# Patient Record
Sex: Male | Born: 1990 | Race: Black or African American | Hispanic: No | Marital: Single | Smoking: Current some day smoker
Health system: Southern US, Community
[De-identification: ages and names within clinical notes are randomized; demographics above are authoritative.]

## PROBLEM LIST (undated history)

## (undated) DIAGNOSIS — J45909 Unspecified asthma, uncomplicated: Secondary | ICD-10-CM

---

## 2011-06-21 ENCOUNTER — Encounter (HOSPITAL_COMMUNITY): Payer: Self-pay | Admitting: Emergency Medicine

## 2011-06-21 ENCOUNTER — Emergency Department (HOSPITAL_COMMUNITY)
Admission: EM | Admit: 2011-06-21 | Discharge: 2011-06-21 | Disposition: A | Discharge status: Home / Self Care | Payer: Medicaid Other | Attending: Emergency Medicine | Admitting: Emergency Medicine

## 2011-06-21 DIAGNOSIS — J039 Acute tonsillitis, unspecified: Secondary | ICD-10-CM

## 2011-06-21 MED ORDER — AZITHROMYCIN 250 MG PO TABS: ORAL_TABLET | ORAL | Status: DC

## 2011-06-21 MED ORDER — AZITHROMYCIN 250 MG PO TABS: ORAL_TABLET | ORAL | Status: AC

## 2011-06-21 NOTE — Discharge Instructions (Signed)
Gargle with salt water, use throat lozenges, and take Advil for pain. See the doctor of your choice for problems.     Tonsillitis Tonsils are lumps of tissue at the back of the throat. Tonsillitis is an infection of the throat. This infection causes the tonsils to become red, tender, and puffy (swollen). If germs (bacteria) caused the infection, an antibiotic medicine will be given to you. If your tonsillitis is severe and happens often, you may need to get your tonsils removed (tonsillectomy). HOME CARE   Rest and sleep often.   Drink enough fluids to keep your pee (urine) clear or pale yellow.   While your throat is sore, eat soft or liquid foods like:   Soup.   Ice cream.   Instant breakfast drinks.   Eat frozen ice pops.   Gargle with a warm or cold liquid to help soothe the throat. Gargle with a water and salt mix. Mix 1 teaspoon of salt in 1 cup of water.   Only take medicines as told by your doctor.   If you are given medicines (antibiotics), take them as told. Finish them even if you start to feel better.  GET HELP RIGHT AWAY IF:   You throw up (vomit).   You have a very bad headache.   You have a stiff neck.   You have chest pain.   You have trouble breathing or swallowing.   You have bad throat pain, drooling, or your voice changes.   You have bad pain not helped by medicine.   You cannot fully open your mouth.   You have redness, puffiness, or bad pain in the neck.   You have a fever.   The patient is a child younger than 3 months and has a fever.   The patient is a child older than 3 months and has a fever or problems that do not go away.   The patient is a child older than 3 months, has a fever, and his or her problems get worse.   You have large, tender lumps on your neck.   You have a rash.   You cough up green, yellow-brown, or bloody fluid.   You cannot swallow liquids or food for 24 hours.   The patient is a child and cannot swallow  liquids or food for 12 hours.  MAKE SURE YOU:   Understand these instructions.   Will watch your condition.   Will get help right away if you are not doing well or get worse.  Document Released: 07/10/2007 Document Revised: 01/10/2011 Document Reviewed: 03/29/2010 [facility] Patient Information 2012 [place], [place].   RESOURCE GUIDE  Dental Problems  Patients with Medicaid: [facility] [phone] [address]                                           [phone] [address] Phone:  [phone]                                                  Phone:  [phone]  If unable to pay or uninsured, contact:  [facility] or [facility]. to become qualified for the adult dental clinic.  Chronic Pain Problems Contact [facility]  [phone] Patients need to be referred by their primary care doctor.  Insufficient Money for Medicine Contact [employer] Way:  call "211" or [facility] (936)809-4304.  No Primary Care Doctor Call [facility]  ([phone] Other agencies that provide inexpensive medical care    [facility]  [phone]    [facility]  [phone]    [facility]  [phone]    [facility]  [phone]    [facility]  [phone]    [facility]  [phone]  Psychological Services [facility]  [phone] [facility]  [phone] [facility]   [phone] (emergency services [phone])  Substance Abuse Resources [facility]  ([phone] [facility] ([phone] [facility] [place] [phone] Floydene Flock [phone] Residential & Outpatient Substance Abuse Program  ([phone]  Abuse/Neglect [facility] Child Abuse Hotline 5043017656 [facility] Child Abuse Hotline 939-783-7548 (After Hours)  Emergency Shelter [facility] [phone]  Maternity  Homes Room at the [place] [facility] [phone])[phone] [facility] [phone]  MRSA Hotline #:   236-295-1801    [facility] Resources  [facility] [place]     [employer]                          [facility] [address] [place]                       [address]      [address]  Blondell Reveal Phone:  [phone]                                   Phone:  [phone]                 Phone:  [phone]  [facility] Phone:  [phone]  [facility] Child Abuse Hotline 8541395700 [phone] (After Hours)

## 2011-06-21 NOTE — ED Notes (Signed)
Pt states that he  Slept ouside Monday night and Tuesday woke up w/ sorethroat and body  Aches chills

## 2011-06-21 NOTE — ED Provider Notes (Signed)
History  This chart was scribed for Flint Melter, MD by Bennett Scrape. This patient was seen in room STRE5/STRE5 and the patient's care was started at 11:25AM.  CSN: [id number]  Arrival date & time 06/21/11  1045   First MD Initiated Contact with Patient 06/21/11 1125      Chief Complaint  Patient presents with  . Sore Throat    The history is provided by the patient. No language interpreter was used.    Cameron Turner is a 21 y.o. male who presents to the Emergency Department complaining of 4 days of gradual onset, gradually worsening, constant right-sided sore throat with associated fevers, productive cough of mucus and generalized HA. Fevers were not measured at home. Pt does not currently have a fever in the ED. He states that the pain is worse with swallowing. Pt reports taking Advil and chloraseptic spray with mild temporary improvement. He denies any sick contacts with similar symptoms. He denies abdominal pain, chest pain, back pain, nausea and emesis as associated symptoms. He has no h/o chronic medical conditions.   History reviewed. No pertinent past medical history.  History reviewed. No pertinent past surgical history.  No family history on file.  History  Substance Use Topics  . Smoking status: Not on file  . Smokeless tobacco: Not on file  . Alcohol Use: Not on file      Review of Systems  A complete 10 system review of systems was obtained and all systems are negative except as noted in the HPI and PMH.   Allergies  Review of patient's allergies indicates no known allergies.  Home Medications   Current Outpatient Rx  Name Route Sig Dispense Refill  . AZITHROMYCIN 250 MG PO TABS  Take q Day 2,1,1,1,1 6 each 0    Triage Vitals: BP 111/63  Pulse 112  Temp 98.7 F (37.1 C)  Resp 16  SpO2 96%  Physical Exam  Nursing note and vitals reviewed. Constitutional: He is oriented to person, place, and time. He appears well-developed and well-nourished.    HENT:  Head: Normocephalic and atraumatic.  Right Ear: External ear normal.  Left Ear: External ear normal.       Mild tonsillar swelling on the right without apparent exudate, TMs are normal bilaterally   Eyes: Conjunctivae and EOM are normal. Pupils are equal, round, and reactive to light.  Neck: Normal range of motion and phonation normal. Neck supple.       Tender right jugulodiagastric node   Cardiovascular: Normal rate, regular rhythm, normal heart sounds and intact distal pulses.   Pulmonary/Chest: Effort normal and breath sounds normal. He exhibits no bony tenderness.  Abdominal: Soft. Normal appearance. There is no tenderness.  Musculoskeletal: Normal range of motion.  Neurological: He is alert and oriented to person, place, and time. He has normal strength. No cranial nerve deficit or sensory deficit. He exhibits normal muscle tone. Coordination normal.  Skin: Skin is warm, dry and intact.  Psychiatric: He has a normal mood and affect. His behavior is normal. Judgment and thought content normal.    ED Course  Procedures (including critical care time)  DIAGNOSTIC STUDIES: Oxygen Saturation is 96% on room air, adequate by my interpretation.    COORDINATION OF CARE: 11:40AM-Informed pt of negative strep test. Discussed salt water gurgling, Halls and chloraseptic sprays as discharge plan with pt and pt agreed to plan.    Labs Reviewed  RAPID STREP SCREEN   No results found.   1. Tonsillitis  MDM  Tonsillitis with peritonsillar infection. Doubt serious bacterial illness. He is stable for discharge   I personally performed the services described in this documentation, which was scribed in my presence. The recorded information has been reviewed and considered.    Plan: Home Medications- Zithromax; Home Treatments- Rest and fluids; Recommended follow up- PCP prn    Flint Melter, MD 06/21/11 2041

## 2012-05-01 ENCOUNTER — Emergency Department (HOSPITAL_COMMUNITY): Admission: EM | Admit: 2012-05-01 | Discharge: 2012-05-01 | Discharge status: Against Medical Advice | Payer: Self-pay

## 2012-05-01 ENCOUNTER — Emergency Department (HOSPITAL_COMMUNITY)
Admission: EM | Admit: 2012-05-01 | Discharge: 2012-05-01 | Disposition: A | Discharge status: Home / Self Care | Payer: Self-pay | Attending: Emergency Medicine | Admitting: Emergency Medicine

## 2012-05-01 DIAGNOSIS — W268XXA Contact with other sharp object(s), not elsewhere classified, initial encounter: Secondary | ICD-10-CM | POA: Insufficient documentation

## 2012-05-01 DIAGNOSIS — IMO0002 Reserved for concepts with insufficient information to code with codable children: Secondary | ICD-10-CM

## 2012-05-01 DIAGNOSIS — M7989 Other specified soft tissue disorders: Secondary | ICD-10-CM | POA: Insufficient documentation

## 2012-05-01 DIAGNOSIS — S61409A Unspecified open wound of unspecified hand, initial encounter: Secondary | ICD-10-CM | POA: Insufficient documentation

## 2012-05-01 DIAGNOSIS — Y939 Activity, unspecified: Secondary | ICD-10-CM | POA: Insufficient documentation

## 2012-05-01 DIAGNOSIS — Z23 Encounter for immunization: Secondary | ICD-10-CM | POA: Insufficient documentation

## 2012-05-01 DIAGNOSIS — Y929 Unspecified place or not applicable: Secondary | ICD-10-CM | POA: Insufficient documentation

## 2012-05-01 MED ORDER — CEPHALEXIN 500 MG PO CAPS: 500.0000 mg | ORAL_CAPSULE | Freq: Four times a day (QID) | ORAL | Status: DC

## 2012-05-01 MED ORDER — TETANUS-DIPHTH-ACELL PERTUSSIS 5-2.5-18.5 LF-MCG/0.5 IM SUSP
0.5000 mL | Freq: Once | INTRAMUSCULAR | Status: AC
  Administered 2012-05-01: 0.5 mL via INTRAMUSCULAR
  Filled 2012-05-01: qty 0.5

## 2012-05-01 MED ORDER — TRAMADOL HCL 50 MG PO TABS: 50.0000 mg | ORAL_TABLET | Freq: Four times a day (QID) | ORAL | Status: DC | PRN

## 2012-05-01 NOTE — ED Notes (Signed)
Pt c/o laceration to palm of L hand near thumb. Pt states it was cut on a piece of glass on Wed night. Pt states he was afraid to come in earlier. States wound keeps bleeding and he's been holding pressure on it. States wound is very painful. Pt arrives holding pressure to wound. Wound still bleeding slightly. Pt does not know the last time he got a tetanus shot. Pt states he has a ride home.

## 2012-05-01 NOTE — ED Provider Notes (Signed)
History    This chart was scribed for non-physician practitioner working with Raeford Razor, MD by Smitty Pluck, ED scribe. This patient was seen in room WTR8/WTR8 and the patient's care was started at 8:30 PM.   CSN: [id number]  Arrival date & time 05/01/12  2004       Chief Complaint  Patient presents with  . Extremity Laceration    The history is provided by the patient. No language interpreter was used.   Cameron Turner is a 22 y.o. male who presents to the Emergency Department complaining of left palm laceration onset 2 days ago. He reports constant, moderate left hand pain that has worsened since onset. Pt reports that he cut his left palm with a piece of glass. He states that movement of left fingers and hand aggravate. He mentions that there has been active bleeding since onset. He states he has used peroxide without relief.Pt denies fever, chills, nausea, vomiting, diarrhea, weakness, cough, SOB and any other pain. He is unsure if tetanus vaccination is UTD.    No past medical history on file.  No past surgical history on file.  No family history on file.  History  Substance Use Topics  . Smoking status: Not on file  . Smokeless tobacco: Not on file  . Alcohol Use: Not on file      Review of Systems  Constitutional: Negative for fever and chills.  Respiratory: Negative for shortness of breath.   Gastrointestinal: Negative for nausea and vomiting.  Neurological: Negative for weakness.  All other systems reviewed and are negative.    Allergies  Review of patient's allergies indicates no known allergies.  Home Medications   Current Outpatient Rx  Name  Route  Sig  Dispense  Refill  . cephALEXin (KEFLEX) 500 MG capsule   Oral   Take 1 capsule (500 mg total) by mouth 4 (four) times daily.   28 capsule   0   . traMADol (ULTRAM) 50 MG tablet   Oral   Take 1 tablet (50 mg total) by mouth every 6 (six) hours as needed for pain.   15 tablet   0     BP  124/70  Pulse 69  Temp(Src) 98.2 F (36.8 C) (Oral)  Resp 16  SpO2 100%  Physical Exam  Nursing note and vitals reviewed. Constitutional: He is oriented to person, place, and time. He appears well-developed and well-nourished. No distress.  HENT:  Head: Normocephalic and atraumatic.  Eyes: EOM are normal.  Neck: Neck supple. No tracheal deviation present.  Cardiovascular: Normal rate.   Pulmonary/Chest: Effort normal. No respiratory distress.  Musculoskeletal: Normal range of motion.       Left hand: He exhibits tenderness, laceration and swelling.       Hands: "V' shaped alceration to thenar eminence. Swollen. Wound edges are smooth and clean. Wound still bleeding.  Neurological: He is alert and oriented to person, place, and time.  Skin: Skin is warm and dry.  Psychiatric: He has a normal mood and affect. His behavior is normal.    ED Course  Procedures (including critical care time) DIAGNOSTIC STUDIES: Oxygen Saturation is 100% on room air, normal by my interpretation.    COORDINATION OF CARE: 8:33 PM Discussed ED treatment with pt and pt agrees.   LACERATION REPAIR PROCEDURE NOTE The patient's identification was confirmed and consent was obtained. This procedure was performed by Marlon Pel PA-C at 8:33 PM. Site: left palm Sterile procedures observed: betadine Anesthetic used (type and  amt): 6 ml 1% lidocaine with epi Suture type/size:5-O proline Length:1 cm # of Sutures: 2 Technique:simple interrupted Complexity: complex  Antibx ointment applied: no Tetanus UTD or ordered: ordered Site anesthetized, irrigated with NS, explored without evidence of foreign body, wound well approximated, site covered with dry, sterile dressing.  Patient tolerated procedure well without complications. Instructions for care discussed verbally and patient provided with additional written instructions for homecare and f/u.    Labs Reviewed - No data to display No results  found.   1. Laceration       MDM  Wound thoroughly irrigated and loosely approximated. Wound still bleeding from Wednesday so sutures were necessary.  Rx keflex. Tetanus given in ED.   Pt has been advised of the symptoms that warrant their return to the ED. Patient has voiced understanding and has agreed to follow-up with the PCP or specialist.  I personally performed the services described in this documentation, which was scribed in my presence. The recorded information has been reviewed and is accurate.   Dorthula Matas, PA-C 05/01/12 2220

## 2012-05-06 NOTE — ED Provider Notes (Signed)
Medical screening examination/treatment/procedure(s) were performed by non-physician practitioner and as supervising physician I was immediately available for consultation/collaboration.  Jak Haggar, MD 05/06/12 1304 

## 2012-08-22 ENCOUNTER — Emergency Department (HOSPITAL_COMMUNITY)
Admission: EM | Admit: 2012-08-22 | Discharge: 2012-08-23 | Disposition: A | Discharge status: Home / Self Care | Payer: Self-pay | Attending: Emergency Medicine | Admitting: Emergency Medicine

## 2012-08-22 ENCOUNTER — Encounter (HOSPITAL_COMMUNITY): Payer: Self-pay | Admitting: Emergency Medicine

## 2012-08-22 ENCOUNTER — Emergency Department (HOSPITAL_COMMUNITY): Payer: Self-pay

## 2012-08-22 DIAGNOSIS — R509 Fever, unspecified: Secondary | ICD-10-CM | POA: Insufficient documentation

## 2012-08-22 DIAGNOSIS — R05 Cough: Secondary | ICD-10-CM | POA: Insufficient documentation

## 2012-08-22 DIAGNOSIS — J45901 Unspecified asthma with (acute) exacerbation: Secondary | ICD-10-CM | POA: Insufficient documentation

## 2012-08-22 DIAGNOSIS — R059 Cough, unspecified: Secondary | ICD-10-CM | POA: Insufficient documentation

## 2012-08-22 DIAGNOSIS — F17200 Nicotine dependence, unspecified, uncomplicated: Secondary | ICD-10-CM | POA: Insufficient documentation

## 2012-08-22 DIAGNOSIS — R0789 Other chest pain: Secondary | ICD-10-CM | POA: Insufficient documentation

## 2012-08-22 DIAGNOSIS — J3489 Other specified disorders of nose and nasal sinuses: Secondary | ICD-10-CM | POA: Insufficient documentation

## 2012-08-22 DIAGNOSIS — R5381 Other malaise: Secondary | ICD-10-CM | POA: Insufficient documentation

## 2012-08-22 DIAGNOSIS — R51 Headache: Secondary | ICD-10-CM | POA: Insufficient documentation

## 2012-08-22 DIAGNOSIS — J4521 Mild intermittent asthma with (acute) exacerbation: Secondary | ICD-10-CM

## 2012-08-22 HISTORY — DX: Unspecified asthma, uncomplicated: J45.909

## 2012-08-22 MED ORDER — ALBUTEROL SULFATE (5 MG/ML) 0.5% IN NEBU
5.0000 mg | INHALATION_SOLUTION | RESPIRATORY_TRACT | Status: DC | PRN
  Administered 2012-08-22: 5 mg via RESPIRATORY_TRACT
  Filled 2012-08-22: qty 1

## 2012-08-22 MED ORDER — PREDNISONE 20 MG PO TABS
40.0000 mg | ORAL_TABLET | Freq: Once | ORAL | Status: AC
  Administered 2012-08-23: 40 mg via ORAL
  Filled 2012-08-22: qty 2

## 2012-08-22 MED ORDER — IPRATROPIUM BROMIDE 0.02 % IN SOLN
0.5000 mg | Freq: Once | RESPIRATORY_TRACT | Status: AC
  Administered 2012-08-22: 0.5 mg via RESPIRATORY_TRACT
  Filled 2012-08-22: qty 2.5

## 2012-08-22 NOTE — ED Notes (Signed)
Patient states that he has has had a cough and chest tightness for the last 3 days.

## 2012-08-22 NOTE — ED Notes (Signed)
RT called for neb tx.

## 2012-08-22 NOTE — ED Provider Notes (Signed)
History    This chart was scribed for non-physician practitioner Roxy Horseman, PA working with Olivia Mackie, MD by Quintella Reichert, ED Scribe. This patient was seen in room WTR7/WTR7 and the patient's care was started at 11:33 PM.   CSN: [id number]  Arrival date & time 08/22/12  2302    Chief Complaint  Patient presents with  . Shortness of Breath    The history is provided by the patient. No language interpreter was used.     HPI Comments: Cameron Turner is a 22 y.o. male with h/o asthma who presents to the Emergency Department complaining of 3 days of gradual-onset, gradually-worsening chest tightness with accompanying cough, SOB, headaches, subjective fever, fatigue and rhinorrhea.  Pt states that he has difficulty catching his breath and he has occasionally been waking up gasping for air in the middle of the night.  He also reports intermittently coughing up blood-tinged sputum.  He also notes that when he coughs it causes his lungs to hurt.  Additionally he notes that several days ago he experienced nasal congestion which has since cleared up.  Pt has not used an albuterol inhaler for his symptoms and notes he has not used an inhaler in several years.  Pt denies recent surgeries.  He admits to a recent h/o long car ride to and from [place] several weeks ago.  He is a current smoker.      Past Medical History  Diagnosis Date  . Asthma     History reviewed. No pertinent past surgical history.   No family history on file.   History  Substance Use Topics  . Smoking status: Current Some Day Smoker  . Smokeless tobacco: Not on file  . Alcohol Use: No     Review of Systems A complete 10 system review of systems was obtained and all systems are negative except as noted in the HPI and PMH.     Allergies  Shellfish allergy  Home Medications   Current Outpatient Rx  Name  Route  Sig  Dispense  Refill  . OVER THE COUNTER MEDICATION   Oral   Take 1 tablet by mouth 2  (two) times daily as needed (cold symptoms). "Airborn" OTC supplement          BP 130/66  Pulse 95  Temp(Src) 98.4 F (36.9 C) (Oral)  Resp 18  Wt 190 lb (86.183 kg)  SpO2 100%  Physical Exam  Nursing note and vitals reviewed. Constitutional: He is oriented to person, place, and time. He appears well-developed and well-nourished. No distress.  HENT:  Head: Normocephalic and atraumatic.  Right Ear: External ear normal.  Left Ear: External ear normal.  Nose: Nose normal.  Mouth/Throat: Oropharynx is clear and moist. No oropharyngeal exudate.  Eyes: EOM are normal.  Neck: Neck supple. No tracheal deviation present.  Cardiovascular: Normal rate, regular rhythm and normal heart sounds.  Exam reveals no gallop and no friction rub.   No murmur heard. Pulmonary/Chest: Effort normal. No respiratory distress. He has wheezes. He has no rales. He exhibits no tenderness.  Diffuse wheezes throughout.  Abdominal: Soft. He exhibits no distension.  Musculoskeletal: Normal range of motion.  Neurological: He is alert and oriented to person, place, and time.  Skin: Skin is warm and dry.  Psychiatric: He has a normal mood and affect. His behavior is normal.    ED Course  Procedures (including critical care time)  DIAGNOSTIC STUDIES: Oxygen Saturation is 100% on room air, normal by my interpretation.  COORDINATION OF CARE: 11:37 PM-Informed pt that imaging ruled out pneumonia.  Discussed treatment plan which includes breathing treatment and steroids with pt at bedside and pt agreed to plan.   12:11 AM Lungs are clear to auscultation on reexam.  Labs Reviewed - No data to display  Dg Chest 2 View (if Patient Has Fever And/or Copd)  08/22/2012   *RADIOLOGY REPORT*  Clinical Data: Shortness of breath cough, history of asthma  CHEST - 2 VIEW  Comparison: None.  Findings: The cardiac and mediastinal silhouettes are within normal limits.  The lungs are well inflated.  No airspace  consolidation, pleural effusion, or pulmonary edema identified.  The soft tissues and osseous structures are unremarkable.  IMPRESSION: Normal chest radiograph.  No acute cardiopulmonary process identified.   Original Report Authenticated By: Rise Mu, M.D.    1. Asthma exacerbation, mild intermittent     MDM  Patient ambulated in ED with O2 saturations maintained >90, no current signs of respiratory distress. Lung exam improved after nebulizer treatment. Prednisone given in the ED and pt will bd dc with 5 day burst. Pt states they are breathing at baseline. Pt has been instructed to continue using prescribed medications and to speak with PCP about today's exacerbation.   Perc negative.    I personally performed the services described in this documentation, which was scribed in my presence. The recorded information has been reviewed and is accurate.    Roxy Horseman, PA-C 08/23/12 [phone]

## 2012-08-22 NOTE — ED Notes (Signed)
RT at bedside.

## 2012-08-23 MED ORDER — ALBUTEROL SULFATE HFA 108 (90 BASE) MCG/ACT IN AERS: 2.0000 | INHALATION_SPRAY | RESPIRATORY_TRACT | Status: AC | PRN

## 2012-08-23 MED ORDER — PREDNISONE 20 MG PO TABS: 40.0000 mg | ORAL_TABLET | Freq: Every day | ORAL | Status: AC

## 2012-08-23 MED ORDER — ALBUTEROL SULFATE HFA 108 (90 BASE) MCG/ACT IN AERS
2.0000 | INHALATION_SPRAY | RESPIRATORY_TRACT | Status: DC | PRN
  Filled 2012-08-23: qty 6.7

## 2012-08-23 NOTE — ED Provider Notes (Signed)
Medical screening examination/treatment/procedure(s) were performed by non-physician practitioner and as supervising physician I was immediately available for consultation/collaboration.  Everest Brod M Dimonique Bourdeau, MD 08/23/12 0620 

## 2012-11-23 ENCOUNTER — Encounter (HOSPITAL_COMMUNITY): Payer: Self-pay | Admitting: Emergency Medicine

## 2012-11-23 ENCOUNTER — Emergency Department (INDEPENDENT_AMBULATORY_CARE_PROVIDER_SITE_OTHER)
Admission: EM | Admit: 2012-11-23 | Discharge: 2012-11-23 | Disposition: A | Discharge status: Home / Self Care | Payer: Self-pay | Source: Home / Self Care | Attending: Family Medicine | Admitting: Family Medicine

## 2012-11-23 DIAGNOSIS — J4521 Mild intermittent asthma with (acute) exacerbation: Secondary | ICD-10-CM

## 2012-11-23 DIAGNOSIS — J45901 Unspecified asthma with (acute) exacerbation: Secondary | ICD-10-CM

## 2012-11-23 MED ORDER — METHYLPREDNISOLONE ACETATE 40 MG/ML IJ SUSP
80.0000 mg | Freq: Once | INTRAMUSCULAR | Status: AC
  Administered 2012-11-23: 80 mg via INTRAMUSCULAR

## 2012-11-23 MED ORDER — IPRATROPIUM BROMIDE 0.02 % IN SOLN
0.5000 mg | Freq: Once | RESPIRATORY_TRACT | Status: AC
  Administered 2012-11-23: 0.5 mg via RESPIRATORY_TRACT

## 2012-11-23 MED ORDER — ALBUTEROL SULFATE HFA 108 (90 BASE) MCG/ACT IN AERS: 1.0000 | INHALATION_SPRAY | Freq: Four times a day (QID) | RESPIRATORY_TRACT | Status: AC | PRN

## 2012-11-23 MED ORDER — SODIUM CHLORIDE 0.9 % IJ SOLN
INTRAMUSCULAR | Status: AC
  Filled 2012-11-23: qty 3

## 2012-11-23 MED ORDER — ALBUTEROL SULFATE (5 MG/ML) 0.5% IN NEBU
5.0000 mg | INHALATION_SOLUTION | Freq: Once | RESPIRATORY_TRACT | Status: AC
  Administered 2012-11-23: 5 mg via RESPIRATORY_TRACT

## 2012-11-23 NOTE — ED Notes (Signed)
C/o sob for 3-4 hours today.   Patient was using albuterol but the medication has ran out and patient can not afford medication

## 2012-11-23 NOTE — ED Provider Notes (Signed)
CSN: [id number]     Arrival date & time 11/23/12  1727 History   First MD Initiated Contact with Patient 11/23/12 1901     Chief Complaint  Patient presents with  . Shortness of Breath   (Consider location/radiation/quality/duration/timing/severity/associated sxs/prior Treatment) Patient is a 22 y.o. male presenting with shortness of breath. The history is provided by the patient.  Shortness of Breath Severity:  Mild Onset quality:  Gradual Duration:  3 days Timing:  Intermittent Progression:  Waxing and waning Chronicity:  New Context comment:  Smoker Associated symptoms: wheezing   Associated symptoms: no abdominal pain and no chest pain   Risk factors: tobacco use     Past Medical History  Diagnosis Date  . Asthma    History reviewed. No pertinent past surgical history. History reviewed. No pertinent family history. History  Substance Use Topics  . Smoking status: Current Some Day Smoker  . Smokeless tobacco: Not on file  . Alcohol Use: No    Review of Systems  Constitutional: Negative.   HENT: Negative.   Respiratory: Positive for shortness of breath and wheezing.   Cardiovascular: Negative for chest pain.  Gastrointestinal: Negative for abdominal pain.    Allergies  Shellfish allergy  Home Medications   Current Outpatient Rx  Name  Route  Sig  Dispense  Refill  . albuterol (PROVENTIL HFA;VENTOLIN HFA) 108 (90 BASE) MCG/ACT inhaler   Inhalation   Inhale 2 puffs into the lungs every 4 (four) hours as needed for wheezing or shortness of breath.   1 Inhaler   3   . albuterol (PROVENTIL HFA;VENTOLIN HFA) 108 (90 BASE) MCG/ACT inhaler   Inhalation   Inhale 1-2 puffs into the lungs every 6 (six) hours as needed for wheezing.   1 Inhaler   0   . OVER THE COUNTER MEDICATION   Oral   Take 1 tablet by mouth 2 (two) times daily as needed (cold symptoms). "Airborn" OTC supplement         . predniSONE (DELTASONE) 20 MG tablet   Oral   Take 2 tablets (40  mg total) by mouth daily.   10 tablet   0    BP 130/65  Pulse 71  Temp(Src) 98.1 F (36.7 C) (Oral)  Resp 16  SpO2 97% Physical Exam  Nursing note and vitals reviewed. Constitutional: He is oriented to person, place, and time. He appears well-developed and well-nourished.  HENT:  Right Ear: External ear normal.  Left Ear: External ear normal.  Mouth/Throat: Oropharynx is clear and moist.  Neck: Normal range of motion. Neck supple.  Cardiovascular: Normal rate.   Pulmonary/Chest: Effort normal and breath sounds normal. He has no wheezes.  Lymphadenopathy:    He has no cervical adenopathy.  Neurological: He is alert and oriented to person, place, and time.  Skin: Skin is warm and dry.    ED Course  Procedures (including critical care time) Labs Review Labs Reviewed - No data to display Imaging Review No results found.    MDM      Linna Hoff, MD 11/23/12 [phone]

## 2014-10-21 IMAGING — CR DG CHEST 2V
2 series · 2 of 2 positions shown · non-contrast
Comparison: None.

CLINICAL DATA: Shortness of breath cough, history of asthma

CHEST - 2 VIEW

[w chest pa]
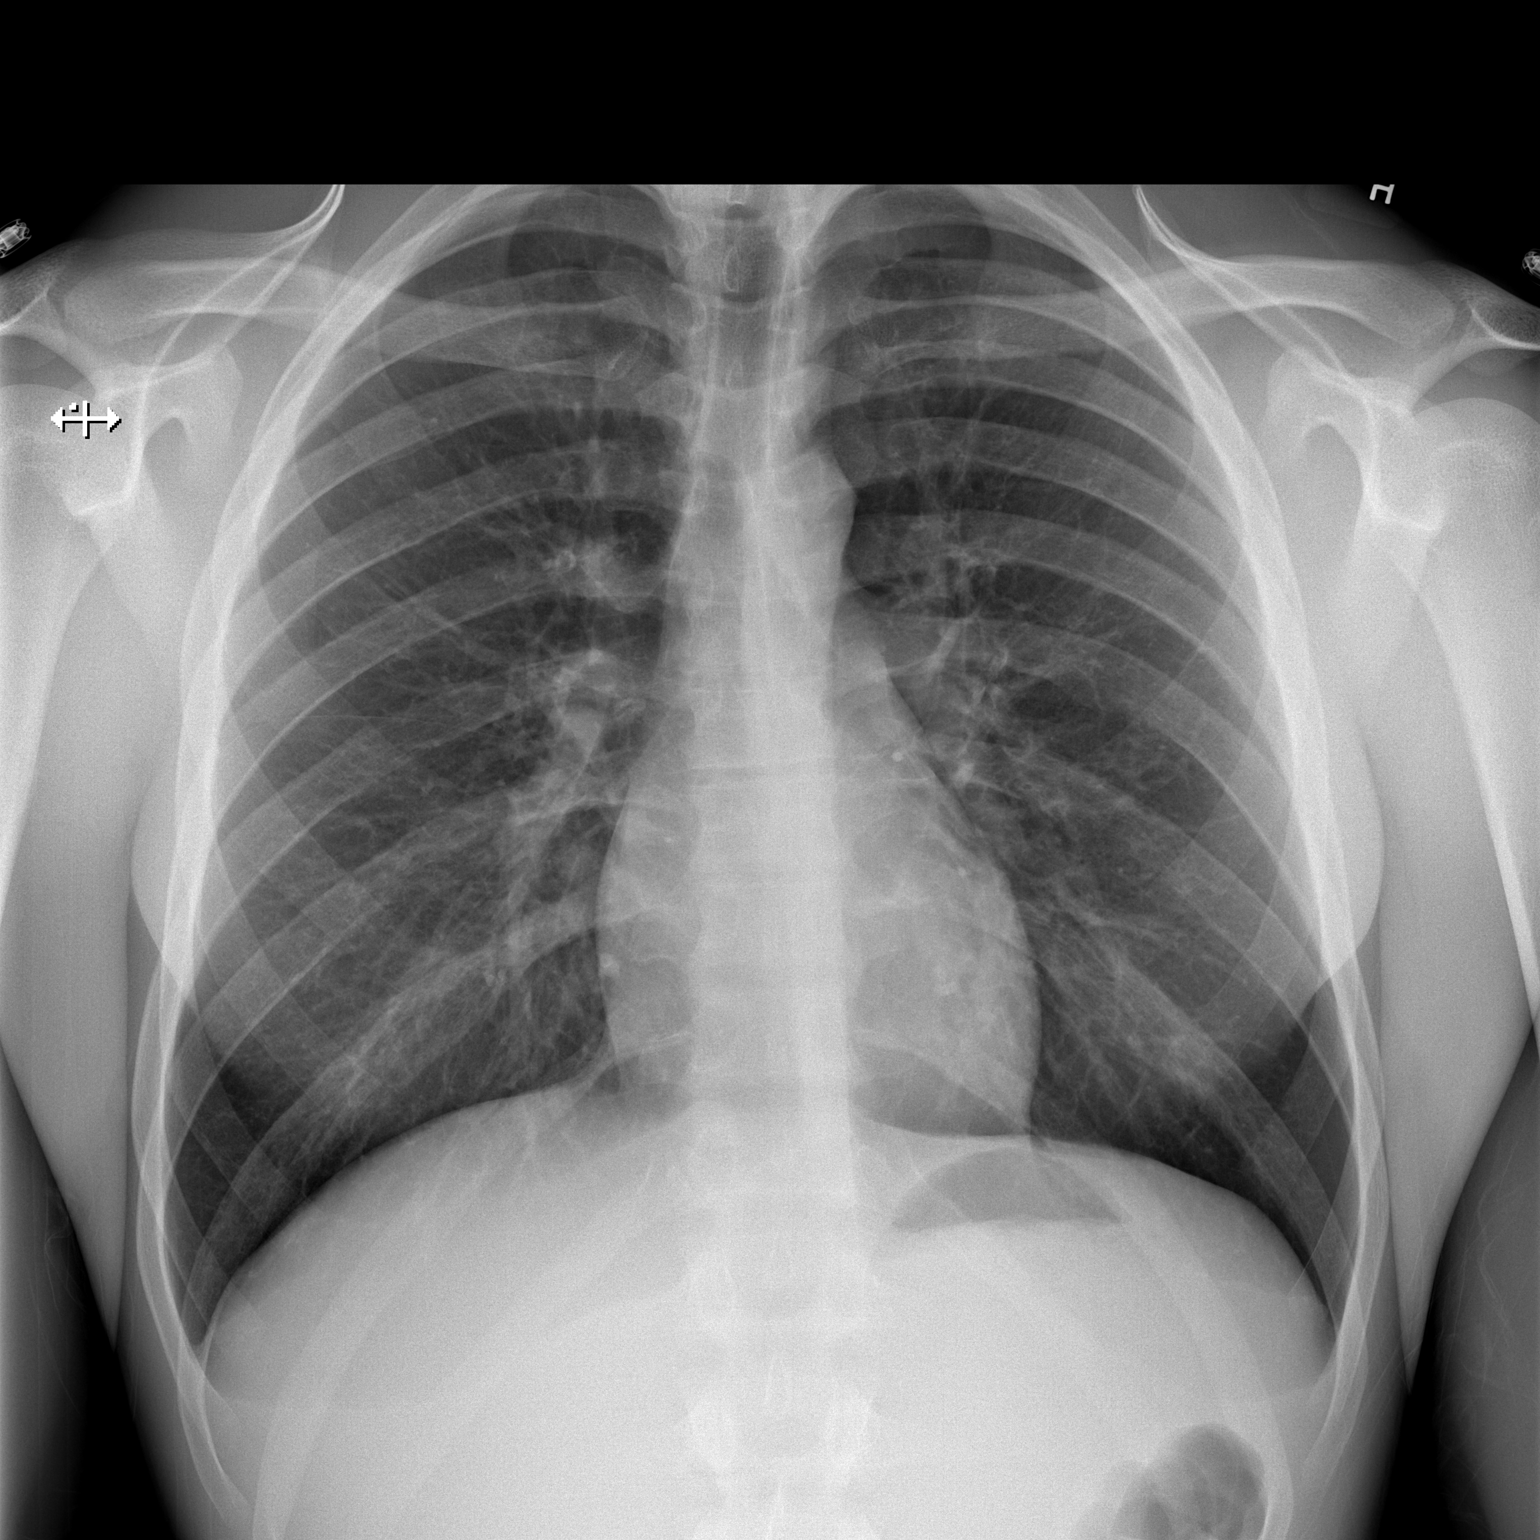

[w chest lat]
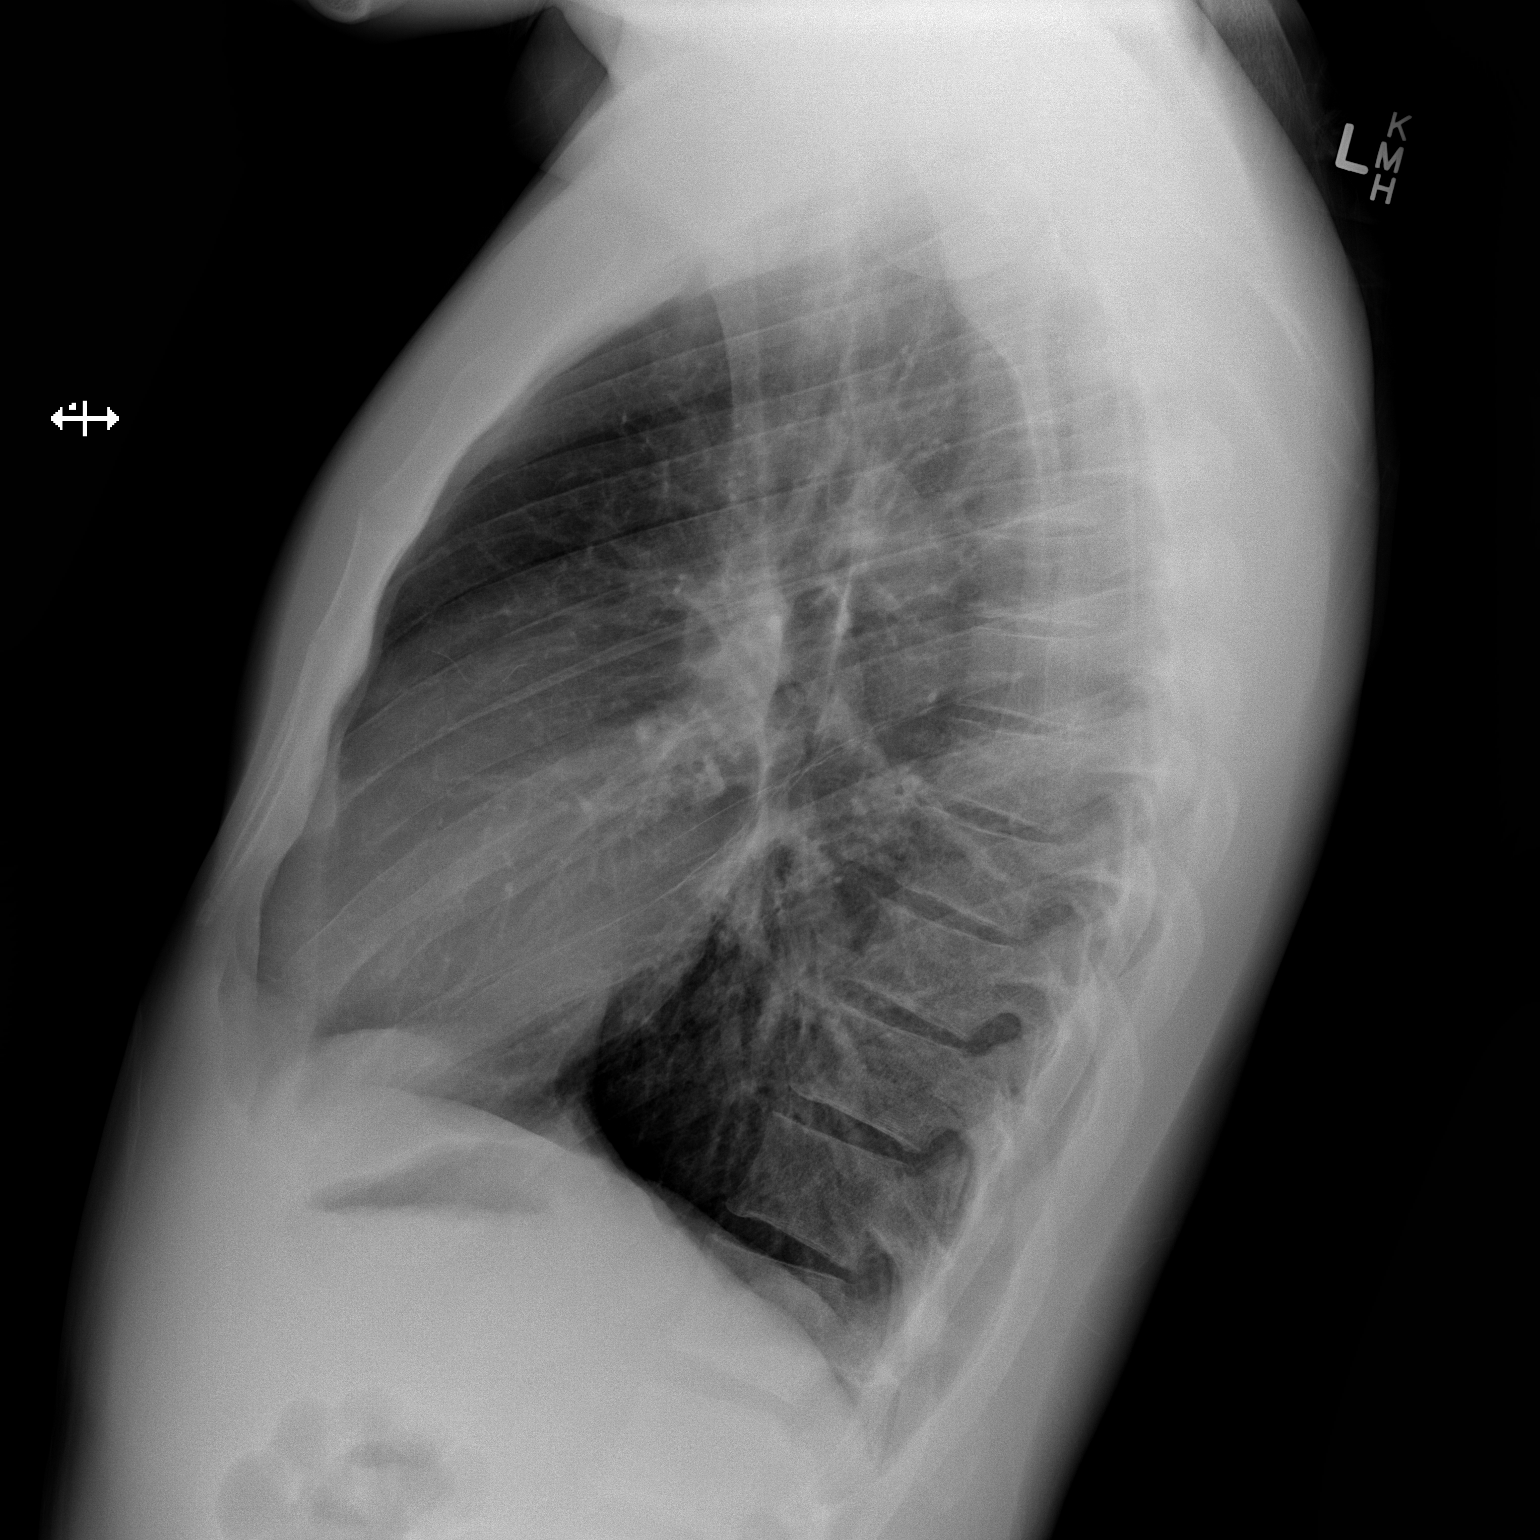

[2 of 2 positions shown; findings below may reference images not displayed]

FINDINGS: The cardiac and mediastinal silhouettes are within normal
limits.

The lungs are well inflated.  No airspace consolidation, pleural
effusion, or pulmonary edema identified.

The soft tissues and osseous structures are unremarkable.
IMPRESSION: Normal chest radiograph.  No acute cardiopulmonary process
identified.]

## 2015-06-09 DIAGNOSIS — Z79899 Other long term (current) drug therapy: Secondary | ICD-10-CM | POA: Insufficient documentation

## 2015-06-09 DIAGNOSIS — J45909 Unspecified asthma, uncomplicated: Secondary | ICD-10-CM | POA: Insufficient documentation

## 2015-06-09 DIAGNOSIS — J069 Acute upper respiratory infection, unspecified: Secondary | ICD-10-CM | POA: Insufficient documentation

## 2015-06-09 DIAGNOSIS — F17200 Nicotine dependence, unspecified, uncomplicated: Secondary | ICD-10-CM | POA: Insufficient documentation

## 2015-06-10 ENCOUNTER — Encounter (HOSPITAL_COMMUNITY): Payer: Self-pay | Admitting: *Deleted

## 2015-06-10 ENCOUNTER — Emergency Department (HOSPITAL_COMMUNITY): Payer: Medicaid Other

## 2015-06-10 ENCOUNTER — Emergency Department (HOSPITAL_COMMUNITY)
Admission: EM | Admit: 2015-06-10 | Discharge: 2015-06-10 | Disposition: A | Discharge status: Home / Self Care | Payer: Medicaid Other | Attending: Emergency Medicine | Admitting: Emergency Medicine

## 2015-06-10 DIAGNOSIS — J069 Acute upper respiratory infection, unspecified: Secondary | ICD-10-CM

## 2015-06-10 LAB — I-STAT CHEM 8, ED
BUN: 11 mg/dL (ref 6–20)
Calcium, Ion: 1.14 mmol/L (ref 1.12–1.23)
Chloride: 100 mmol/L — ABNORMAL LOW (ref 101–111)
Creatinine, Ser: 1.1 mg/dL (ref 0.61–1.24)
Glucose, Bld: 96 mg/dL (ref 65–99)
HEMATOCRIT: 48 % (ref 39.0–52.0)
Hemoglobin: 16.3 g/dL (ref 13.0–17.0)
POTASSIUM: 3.9 mmol/L (ref 3.5–5.1)
SODIUM: 140 mmol/L (ref 135–145)
TCO2: 24 mmol/L (ref 0–100)

## 2015-06-10 LAB — RAPID STREP SCREEN (MED CTR MEBANE ONLY): Streptococcus, Group A Screen (Direct): NEGATIVE

## 2015-06-10 MED ORDER — GUAIFENESIN-CODEINE 100-10 MG/5ML PO SOLN: 5.0000 mL | Freq: Three times a day (TID) | ORAL | Status: AC | PRN

## 2015-06-10 MED ORDER — AMOXICILLIN 500 MG PO CAPS
500.0000 mg | ORAL_CAPSULE | Freq: Once | ORAL | Status: AC
  Administered 2015-06-10: 500 mg via ORAL
  Filled 2015-06-10: qty 1

## 2015-06-10 MED ORDER — ACETAMINOPHEN 325 MG PO TABS
650.0000 mg | ORAL_TABLET | Freq: Once | ORAL | Status: AC
  Administered 2015-06-10: 650 mg via ORAL
  Filled 2015-06-10: qty 2

## 2015-06-10 MED ORDER — OXYCODONE-ACETAMINOPHEN 5-325 MG PO TABS
1.0000 | ORAL_TABLET | Freq: Once | ORAL | Status: AC
Start: 2015-06-10 — End: 2015-06-10
  Administered 2015-06-10: 1 via ORAL
  Filled 2015-06-10: qty 1

## 2015-06-10 MED ORDER — AMOXICILLIN 500 MG PO CAPS: 500.0000 mg | ORAL_CAPSULE | Freq: Three times a day (TID) | ORAL | Status: AC

## 2015-06-10 NOTE — ED Notes (Signed)
He was actually in the garage yesterday

## 2015-06-10 NOTE — ED Provider Notes (Signed)
CSN: [id number]     Arrival date & time 06/09/15  2357 History   First MD Initiated Contact with Patient 06/10/15 0145     Chief Complaint  Patient presents with  . Cough  . Fever     (Consider location/radiation/quality/duration/timing/severity/associated sxs/prior Treatment) HPI   Patient has a past medical history of asthma and smoking presents to the emergency department for evaluation of his cough, sore throat, nasal congestion, fever and not feeling well. He does smell of gasoline. 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 He reports sleeping fumes for a little under an hour. He denies having any headache, confusion or change in vision, weakness, syncope, Lotrimin swelling shortness of breath, back pain, abdominal pain, nausea, vomiting, diarrhea. Max temp is 100.3  Past Medical History  Diagnosis Date  . Asthma    History reviewed. No pertinent past surgical history. No family history on file. Social History  Substance Use Topics  . Smoking status: Current Some Day Smoker  . Smokeless tobacco: None  . Alcohol Use: No    Review of Systems  Review of Systems All other systems negative except as documented in the HPI. All pertinent positives and negatives as reviewed in the HPI.   Allergies  Shellfish allergy  Home Medications   Prior to Admission medications   Medication Sig Start Date End Date Taking? Authorizing Provider  albuterol (PROVENTIL HFA;VENTOLIN HFA) 108 (90 BASE) MCG/ACT inhaler Inhale 2 puffs into the lungs every 4 (four) hours as needed for wheezing or shortness of breath. Patient not taking: Reported on 06/10/2015 08/23/12   Roxy Horseman, PA-C  albuterol (PROVENTIL HFA;VENTOLIN HFA) 108 (90 BASE) MCG/ACT inhaler Inhale 1-2 puffs into the lungs every 6 (six) hours as needed for wheezing. Patient not taking: Reported on 06/10/2015 11/23/12   Linna Hoff, MD  amoxicillin (AMOXIL) 500 MG capsule Take 1  capsule (500 mg total) by mouth 3 (three) times daily. 06/10/15   Zalmen Wrightsman Neva Seat, PA-C  guaiFENesin-codeine 100-10 MG/5ML syrup Take 5-10 mLs by mouth 3 (three) times daily as needed for cough. 06/10/15   Ankur Snowdon Neva Seat, PA-C  predniSONE (DELTASONE) 20 MG tablet Take 2 tablets (40 mg total) by mouth daily. Patient not taking: Reported on 06/10/2015 08/23/12   Roxy Horseman, PA-C   BP 132/62 mmHg  Pulse 79  Temp(Src) 99.1 F (37.3 C) (Oral)  Resp 18  Ht  (1.854 m)  Wt 86.24 kg  BMI 25.09 kg/m2  SpO2 97% Physical Exam  Constitutional: He is oriented to person, place, and time. He appears well-developed and well-nourished. No distress.  HENT:  Head: Normocephalic and atraumatic.  Right Ear: Tympanic membrane, external ear and ear canal normal.  Left Ear: Tympanic membrane, external ear and ear canal normal.  Nose: Nose normal. No rhinorrhea. Right sinus exhibits no maxillary sinus tenderness and no frontal sinus tenderness. Left sinus exhibits no maxillary sinus tenderness and no frontal sinus tenderness.  Mouth/Throat: Uvula is midline and mucous membranes are normal. No trismus in the jaw. Normal dentition. No dental abscesses or uvula swelling. Posterior oropharyngeal edema present. No oropharyngeal exudate, posterior oropharyngeal erythema or tonsillar abscesses.  No submental edema, tongue not elevated, no trismus. No impending airway obstruction; Pt able to speak full sentences, swallow intact, no drooling, stridor, or tonsillar/uvula displacement. No palatal petechia  Eyes: Conjunctivae are normal.  Neck: Trachea normal, normal range of motion and full passive range of motion without pain.  Neck supple. No rigidity. Normal range of motion present. No Brudzinski's sign noted.  Flexion and extension of neck without pain or difficulty. Able to breath without difficulty in extension.  Cardiovascular: Normal rate and regular rhythm.   Pulmonary/Chest: Effort normal and breath sounds normal.  No stridor. No respiratory distress. He has no wheezes.  Abdominal: Soft. There is no tenderness.  No obvious evidence of splenomegaly. Non ttp.   Musculoskeletal: Normal range of motion.  Lymphadenopathy:       Head (right side): No preauricular and no posterior auricular adenopathy present.       Head (left side): No preauricular and no posterior auricular adenopathy present.    He has cervical adenopathy.  Neurological: He is alert and oriented to person, place, and time.  Skin: Skin is warm and dry. No rash noted. He is not diaphoretic.  Psychiatric: He has a normal mood and affect.  Nursing note and vitals reviewed.   ED Course  Procedures (including critical care time) Labs Review Labs Reviewed  I-STAT CHEM 8, ED - Abnormal; Notable for the following:    Chloride 100 (*)    All other components within normal limits  RAPID STREP SCREEN (NOT AT [facility])  CULTURE, GROUP A STREP [facility])    Imaging Review Dg Chest 2 View  06/10/2015  CLINICAL DATA:  Inhaled fumes.  Shortness of breath. EXAM: CHEST  2 VIEW COMPARISON:  August 22, 2012 FINDINGS: The heart size and mediastinal contours are within normal limits. Both lungs are clear. The visualized skeletal structures are unremarkable. IMPRESSION: No active cardiopulmonary disease. Electronically Signed   By: Gerome Samavid  Williams III M.D   On: 06/10/2015 00:27   I have personally reviewed and evaluated these images and lab results as part of my medical decision-making.   EKG Interpretation None      MDM   Final diagnoses:  URI (upper respiratory infection)    Negative  Chest, normal i-stat chem 8. Rapid strep is negative MDM Number of Diagnoses or Management Options  Medications  amoxicillin (AMOXIL) capsule 500 mg (not administered)  acetaminophen (TYLENOL) tablet 650 mg (650 mg Oral Given 06/10/15 0013)  oxyCODONE-acetaminophen (PERCOCET/ROXICET) 5-325 MG per tablet 1 tablet (1 tablet Oral Given 06/10/15 0254)    Pt symptoms  consistent with URI. CXR negative for acute infiltrate. Pt will be discharged with symptomatic treatment.  Discussed return precautions.  Pt is hemodynamically stable & in NAD prior to discharge.  Rx: Amoxicillin and guaifen w/ codeine. Also given a work note and advised to change from gasoline clothes and not to smoke any cigarettes until he has showered and changed or be near fire.  I discussed results, diagnoses and plan with Cameron Turner. They voice there understanding and questions were answered. We discussed follow-up recommendations and return precautions.    Marlon Peliffany Zelena Bushong, PA-C 06/10/15 0406  Marlon Peliffany Selso Mannor, PA-C 06/10/15 0406  Azalia BilisKevin Campos, MD 06/10/15 [phone]

## 2015-06-10 NOTE — ED Notes (Signed)
Gave patient cup for urine sample, but "he forgot" to catch it.

## 2015-06-10 NOTE — ED Notes (Signed)
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

## 2015-06-10 NOTE — ED Notes (Addendum)
The pt has an odor of gasoline about him  The pt reports that these are the same clothes he was wearing in the garage yesterday.  He has not changed or cleaned his clothes since then ???

## 2015-06-10 NOTE — Discharge Instructions (Signed)
Upper Respiratory Infection, Adult  Most upper respiratory infections (URIs) are a viral infection of the air passages leading to the lungs. A URI affects the nose, throat, and upper air passages. The most common type of URI is nasopharyngitis and is typically referred to as "the common cold."  URIs run their course and usually go away on their own. Most of the time, a URI does not require medical attention, but sometimes a bacterial infection in the upper airways can follow a viral infection. This is called a secondary infection. Sinus and middle ear infections are common types of secondary upper respiratory infections.  Bacterial pneumonia can also complicate a URI. A URI can worsen asthma and chronic obstructive pulmonary disease (COPD). Sometimes, these complications can require emergency medical care and may be life threatening.   CAUSES  Almost all URIs are caused by viruses. A virus is a type of germ and can spread from one person to another.   RISKS FACTORS  You may be at risk for a URI if:    You smoke.    You have chronic heart or lung disease.   You have a weakened defense (immune) system.    You are very young or very old.    You have nasal allergies or asthma.   You work in crowded or poorly ventilated areas.   You work in health care facilities or schools.  SIGNS AND SYMPTOMS   Symptoms typically develop 2-3 days after you come in contact with a cold virus. Most viral URIs last 7-10 days. However, viral URIs from the influenza virus (flu virus) can last 14-18 days and are typically more severe. Symptoms may include:    Runny or stuffy (congested) nose.    Sneezing.    Cough.    Sore throat.    Headache.    Fatigue.    Fever.    Loss of appetite.    Pain in your forehead, behind your eyes, and over your cheekbones (sinus pain).   Muscle aches.   DIAGNOSIS   Your health care provider may diagnose a URI by:   Physical exam.   Tests to check that your symptoms are not due to  another condition such as:   Strep throat.   Sinusitis.   Pneumonia.   Asthma.  TREATMENT   A URI goes away on its own with time. It cannot be cured with medicines, but medicines may be prescribed or recommended to relieve symptoms. Medicines may help:   Reduce your fever.   Reduce your cough.   Relieve nasal congestion.  HOME CARE INSTRUCTIONS    Take medicines only as directed by your health care provider.    Gargle warm saltwater or take cough drops to comfort your throat as directed by your health care provider.   Use a warm mist humidifier or inhale steam from a shower to increase air moisture. This may make it easier to breathe.   Drink enough fluid to keep your urine clear or pale yellow.    Eat soups and other clear broths and maintain good nutrition.    Rest as needed.    Return to work when your temperature has returned to normal or as your health care provider advises. You may need to stay home longer to avoid infecting others. You can also use a face mask and careful hand washing to prevent spread of the virus.   Increase the usage of your inhaler if you have asthma.    Do not   use any tobacco products, including cigarettes, chewing tobacco, or electronic cigarettes. If you need help quitting, ask your health care provider.  PREVENTION   The best way to protect yourself from getting a cold is to practice good hygiene.    Avoid oral or hand contact with people with cold symptoms.    Wash your hands often if contact occurs.   There is no clear evidence that vitamin C, vitamin E, echinacea, or exercise reduces the chance of developing a cold. However, it is always recommended to get plenty of rest, exercise, and practice good nutrition.   SEEK MEDICAL CARE IF:    You are getting worse rather than better.    Your symptoms are not controlled by medicine.    You have chills.   You have worsening shortness of breath.   You have brown or red mucus.   You have yellow or brown nasal  discharge.   You have pain in your face, especially when you bend forward.   You have a fever.   You have swollen neck glands.   You have pain while swallowing.   You have white areas in the back of your throat.  SEEK IMMEDIATE MEDICAL CARE IF:    You have severe or persistent:    Headache.    Ear pain.    Sinus pain.    Chest pain.   You have chronic lung disease and any of the following:    Wheezing.    Prolonged cough.    Coughing up blood.    A change in your usual mucus.   You have a stiff neck.   You have changes in your:    Vision.    Hearing.    Thinking.    Mood.  MAKE SURE YOU:    Understand these instructions.   Will watch your condition.   Will get help right away if you are not doing well or get worse.     This information is not intended to replace advice given to you by your health care provider. Make sure you discuss any questions you have with your health care provider.     Document Released: 07/17/2000 Document Revised: 06/07/2014 Document Reviewed: 04/28/2013  Elsevier Interactive Patient Education 2016 [employer]  Pharyngitis  Pharyngitis is redness, pain, and swelling (inflammation) of your pharynx.   CAUSES   Pharyngitis is usually caused by infection. Most of the time, these infections are from viruses (viral) and are part of a cold. However, sometimes pharyngitis is caused by bacteria (bacterial). Pharyngitis can also be caused by allergies. Viral pharyngitis may be spread from person to person by coughing, sneezing, and personal items or utensils (cups, forks, spoons, toothbrushes). Bacterial pharyngitis may be spread from person to person by more intimate contact, such as kissing.   SIGNS AND SYMPTOMS   Symptoms of pharyngitis include:    Sore throat.    Tiredness (fatigue).    Low-grade fever.    Headache.   Joint pain and muscle aches.   Skin rashes.   Swollen lymph nodes.   Plaque-like film on throat or tonsils (often seen with bacterial  pharyngitis).  DIAGNOSIS   Your health care provider will ask you questions about your illness and your symptoms. Your medical history, along with a physical exam, is often all that is needed to diagnose pharyngitis. Sometimes, a rapid strep test is done. Other lab tests may also be done, depending on the suspected cause.   TREATMENT   Viral   pharyngitis will usually get better in 3-4 days without the use of medicine. Bacterial pharyngitis is treated with medicines that kill germs (antibiotics).   HOME CARE INSTRUCTIONS    Drink enough water and fluids to keep your urine clear or pale yellow.    Only take over-the-counter or prescription medicines as directed by your health care provider:     If you are prescribed antibiotics, make sure you finish them even if you start to feel better.     Do not take aspirin.    Get lots of rest.    Gargle with 8 oz of salt water ( tsp of salt per 1 qt of water) as often as every 1-2 hours to soothe your throat.    Throat lozenges (if you are not at risk for choking) or sprays may be used to soothe your throat.  SEEK MEDICAL CARE IF:    You have large, tender lumps in your neck.   You have a rash.   You cough up green, yellow-brown, or bloody spit.  SEEK IMMEDIATE MEDICAL CARE IF:    Your neck becomes stiff.   You drool or are unable to swallow liquids.   You vomit or are unable to keep medicines or liquids down.   You have severe pain that does not go away with the use of recommended medicines.   You have trouble breathing (not caused by a stuffy nose).  MAKE SURE YOU:    Understand these instructions.   Will watch your condition.   Will get help right away if you are not doing well or get worse.     This information is not intended to replace advice given to you by your health care provider. Make sure you discuss any questions you have with your health care provider.     Document Released: 01/21/2005 Document Revised: 11/11/2012 Document Reviewed:  09/28/2012  Elsevier Interactive Patient Education 2016 [employer]

## 2015-06-12 LAB — CULTURE, GROUP A STREP (THRC)

## 2017-08-08 IMAGING — CR DG CHEST 2V
2 series · 2 of 2 positions shown · non-contrast
Comparison: August 22, 2012

CLINICAL DATA: Inhaled fumes.  Shortness of breath.

EXAM:
CHEST  2 VIEW

[chest pa]
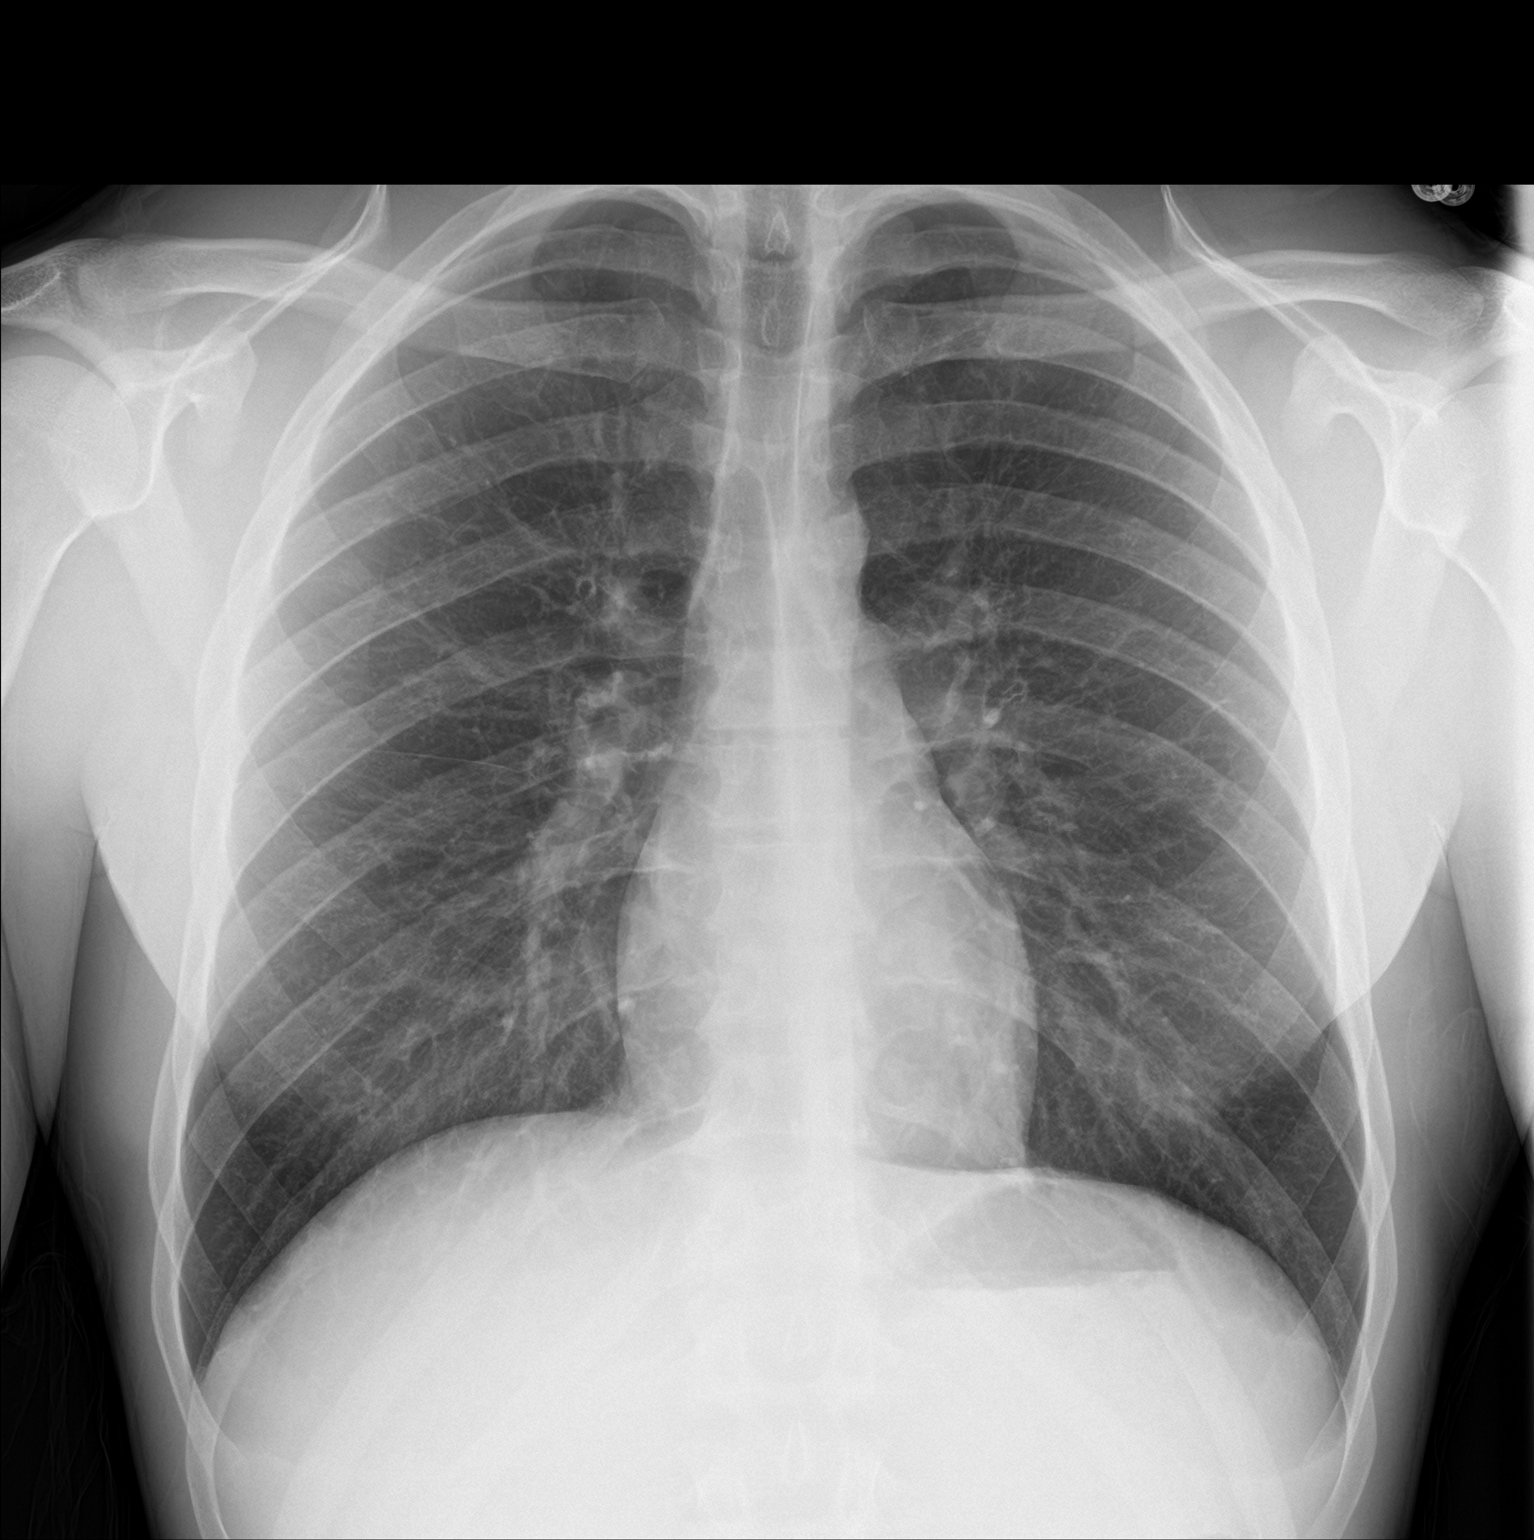

[chest lat]
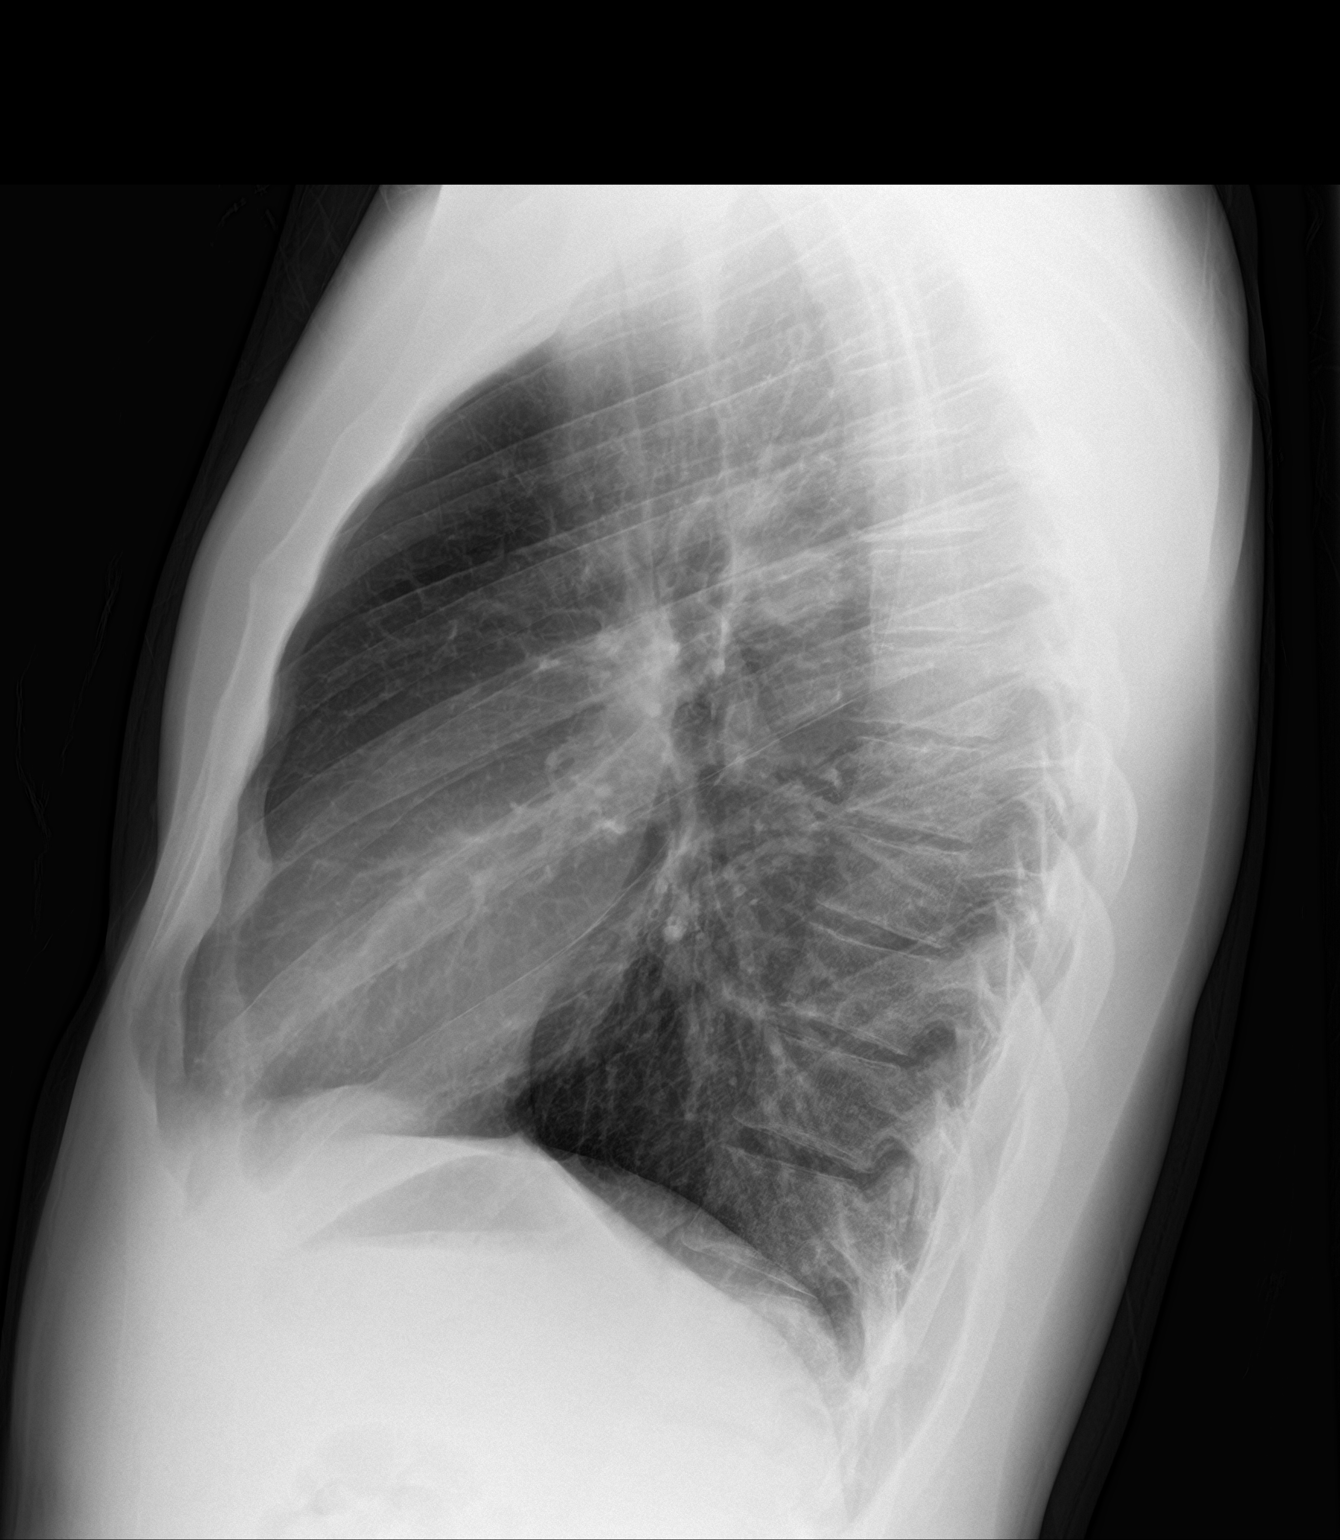

[2 of 2 positions shown; findings below may reference images not displayed]

FINDINGS: The heart size and mediastinal contours are within normal limits.
Both lungs are clear. The visualized skeletal structures are
unremarkable.
IMPRESSION: No active cardiopulmonary disease.
